# Patient Record
Sex: Female | Born: 1960 | Race: White | Hispanic: No
Health system: Southern US, Community
[De-identification: ages and names within clinical notes are randomized; demographics above are authoritative.]

## PROBLEM LIST (undated history)

## (undated) DIAGNOSIS — I82621 Acute embolism and thrombosis of deep veins of right upper extremity: Secondary | ICD-10-CM

## (undated) DIAGNOSIS — R410 Disorientation, unspecified: Secondary | ICD-10-CM

## (undated) DIAGNOSIS — J9621 Acute and chronic respiratory failure with hypoxia: Secondary | ICD-10-CM

## (undated) DIAGNOSIS — J690 Pneumonitis due to inhalation of food and vomit: Secondary | ICD-10-CM

## (undated) DIAGNOSIS — I63442 Cerebral infarction due to embolism of left cerebellar artery: Secondary | ICD-10-CM

---

## 2020-10-27 ENCOUNTER — Other Ambulatory Visit (HOSPITAL_COMMUNITY): Payer: Medicare HMO

## 2020-10-27 ENCOUNTER — Inpatient Hospital Stay
Admission: AD | Admit: 2020-10-27 | Discharge: 2020-11-16 | Disposition: A | Discharge status: Skilled Nursing Facility | Payer: Medicare HMO | Source: Other Acute Inpatient Hospital | Attending: Internal Medicine | Admitting: Internal Medicine

## 2020-10-27 DIAGNOSIS — J690 Pneumonitis due to inhalation of food and vomit: Secondary | ICD-10-CM | POA: Diagnosis present

## 2020-10-27 DIAGNOSIS — I63442 Cerebral infarction due to embolism of left cerebellar artery: Secondary | ICD-10-CM | POA: Diagnosis present

## 2020-10-27 DIAGNOSIS — T17908A Unspecified foreign body in respiratory tract, part unspecified causing other injury, initial encounter: Secondary | ICD-10-CM

## 2020-10-27 DIAGNOSIS — Z931 Gastrostomy status: Secondary | ICD-10-CM

## 2020-10-27 DIAGNOSIS — J9690 Respiratory failure, unspecified, unspecified whether with hypoxia or hypercapnia: Secondary | ICD-10-CM

## 2020-10-27 DIAGNOSIS — I82621 Acute embolism and thrombosis of deep veins of right upper extremity: Secondary | ICD-10-CM | POA: Diagnosis present

## 2020-10-27 DIAGNOSIS — D72829 Elevated white blood cell count, unspecified: Secondary | ICD-10-CM

## 2020-10-27 DIAGNOSIS — J9621 Acute and chronic respiratory failure with hypoxia: Secondary | ICD-10-CM | POA: Diagnosis present

## 2020-10-27 DIAGNOSIS — R410 Disorientation, unspecified: Secondary | ICD-10-CM | POA: Diagnosis present

## 2020-10-27 DIAGNOSIS — J189 Pneumonia, unspecified organism: Secondary | ICD-10-CM

## 2020-10-27 DIAGNOSIS — Z9289 Personal history of other medical treatment: Secondary | ICD-10-CM

## 2020-10-27 HISTORY — DX: Acute embolism and thrombosis of deep veins of right upper extremity: I82.621

## 2020-10-27 HISTORY — DX: Disorientation, unspecified: R41.0

## 2020-10-27 HISTORY — DX: Cerebral infarction due to embolism of left cerebellar artery: I63.442

## 2020-10-27 HISTORY — DX: Acute and chronic respiratory failure with hypoxia: J96.21

## 2020-10-27 HISTORY — DX: Pneumonitis due to inhalation of food and vomit: J69.0

## 2020-10-27 LAB — HEPARIN LEVEL (UNFRACTIONATED): Heparin Unfractionated: <0.1 IU/mL — ABNORMAL LOW (ref 0.30–0.70)

## 2020-10-27 LAB — PROTIME-INR
INR: 1.3 — ABNORMAL HIGH (ref 0.8–1.2)
Prothrombin Time: 15.4 seconds — ABNORMAL HIGH (ref 11.4–15.2)

## 2020-10-27 LAB — APTT: aPTT: 120 seconds — ABNORMAL HIGH (ref 24–36)

## 2020-10-28 LAB — COMPREHENSIVE METABOLIC PANEL
ALT: 29 U/L (ref 0–44)
AST: 19 U/L (ref 15–41)
Albumin: 2.1 g/dL — ABNORMAL LOW (ref 3.5–5.0)
Alkaline Phosphatase: 63 U/L (ref 38–126)
Anion gap: 10 (ref 5–15)
BUN: 14 mg/dL (ref 6–20)
CO2: 23 mmol/L (ref 22–32)
Calcium: 8.5 mg/dL — ABNORMAL LOW (ref 8.9–10.3)
Chloride: 100 mmol/L (ref 98–111)
Creatinine, Ser: 0.37 mg/dL — ABNORMAL LOW (ref 0.44–1.00)
GFR, Estimated: >60 mL/min (ref >60)
Glucose, Bld: 144 mg/dL — ABNORMAL HIGH (ref 70–99)
Potassium: 3.8 mmol/L (ref 3.5–5.1)
Sodium: 133 mmol/L — ABNORMAL LOW (ref 135–145)
Total Bilirubin: 0.6 mg/dL (ref 0.3–1.2)
Total Protein: 6.2 g/dL — ABNORMAL LOW (ref 6.5–8.1)

## 2020-10-28 LAB — CBC WITH DIFFERENTIAL/PLATELET
Abs Immature Granulocytes: 0.16 10*3/uL — ABNORMAL HIGH (ref 0.00–0.07)
Basophils Absolute: 0 10*3/uL (ref 0.0–0.1)
Basophils Relative: 0 %
Eosinophils Absolute: 0.2 10*3/uL (ref 0.0–0.5)
Eosinophils Relative: 1 %
HCT: 25.5 % — ABNORMAL LOW (ref 36.0–46.0)
Hemoglobin: 8.2 g/dL — ABNORMAL LOW (ref 12.0–15.0)
Immature Granulocytes: 1 %
Lymphocytes Relative: 9 %
Lymphs Abs: 1.2 10*3/uL (ref 0.7–4.0)
MCH: 29.3 pg (ref 26.0–34.0)
MCHC: 32.2 g/dL (ref 30.0–36.0)
MCV: 91.1 fL (ref 80.0–100.0)
Monocytes Absolute: 1.1 10*3/uL — ABNORMAL HIGH (ref 0.1–1.0)
Monocytes Relative: 8 %
Neutro Abs: 11.1 10*3/uL — ABNORMAL HIGH (ref 1.7–7.7)
Neutrophils Relative %: 81 %
Platelets: 258 10*3/uL (ref 150–400)
RBC: 2.8 MIL/uL — ABNORMAL LOW (ref 3.87–5.11)
RDW: 14.8 % (ref 11.5–15.5)
WBC: 13.7 10*3/uL — ABNORMAL HIGH (ref 4.0–10.5)
nRBC: 0 % (ref 0.0–0.2)

## 2020-10-28 LAB — HEPARIN LEVEL (UNFRACTIONATED)
Heparin Unfractionated: 0.3 IU/mL (ref 0.30–0.70)
Heparin Unfractionated: 0.31 IU/mL (ref 0.30–0.70)

## 2020-10-29 ENCOUNTER — Encounter: Payer: Self-pay | Admitting: Internal Medicine

## 2020-10-29 DIAGNOSIS — R410 Disorientation, unspecified: Secondary | ICD-10-CM | POA: Diagnosis not present

## 2020-10-29 DIAGNOSIS — I82621 Acute embolism and thrombosis of deep veins of right upper extremity: Secondary | ICD-10-CM | POA: Diagnosis present

## 2020-10-29 DIAGNOSIS — J9621 Acute and chronic respiratory failure with hypoxia: Secondary | ICD-10-CM | POA: Diagnosis not present

## 2020-10-29 DIAGNOSIS — I63442 Cerebral infarction due to embolism of left cerebellar artery: Secondary | ICD-10-CM | POA: Diagnosis not present

## 2020-10-29 DIAGNOSIS — J690 Pneumonitis due to inhalation of food and vomit: Secondary | ICD-10-CM | POA: Diagnosis not present

## 2020-10-29 LAB — HEPARIN LEVEL (UNFRACTIONATED)
Heparin Unfractionated: 0.38 IU/mL (ref 0.30–0.70)
Heparin Unfractionated: 0.34 IU/mL (ref 0.30–0.70)

## 2020-10-29 NOTE — Consult Note (Addendum)
Pulmonary Critical Care Medicine [facility] [place]  PULMONARY SERVICE  Date of Service: 10/29/2020  PULMONARY CRITICAL CARE CONSULT   LIRA STEPHEN  CLE:[id number]  DOB: 1961-09-25   DOA: 10/27/2020  Referring Physician: Carron Curie, MD  HPI: EMI LYMON is a 59 y.o. female seen for follow up of Acute on Chronic Respiratory Failure. Patient has multiple medical problems who was transferred for further management.  Apparently was admitted to the hospital with left-sided ataxia and sensory loss at that time was evaluated and found to have an acute stroke.  Patient was admitted to the emergency room with a new left-sided hemiparesis which had not been present on previous admission.  Patient was deemed to be out of the window for TPA CT angiogram was performed which showed a new area of thrombus in the left vertebral artery.  The patient had also been apparently complaining about neck pain.  Admitted to [facility] and was started on heparin drip.  CT scans were done to evaluate the evolution.  Eventually an MRI was found there was a infarction on the right side of the cord down to the level of C3-C4 she was placed on steroids for this finding.  Patient had a DVT also diagnosed and was continued on heparin.  Transferred to our facility for further management and weaning.  Review of Systems:  ROS performed and is unremarkable other than noted above.  Past medical history: CVA Thrombosis Neck mass Delirium Alcohol abuse Hyperlipidemia Dysphagia DVT Hypertension GERD Depression  Past surgical history: Breast biopsy Lumpectomy Tubal ligation  Allergies: Norco  Family history: Hypertension Hypothyroidism  Medications: Reviewed on Rounds  Physical Exam:  Vitals: Temperature is 97.2 pulse 92 respiratory rate 14 blood pressure is 102/68 saturations 94%  Ventilator Settings currently is off the ventilator on T collar FiO2  28%  . General: Comfortable at this time . Eyes: Grossly normal lids, irises & conjunctiva . ENT: grossly tongue is normal . Neck: no obvious mass . Cardiovascular: S1-S2 normal no gallop or rub . Respiratory: No rhonchi coarse breath sounds . Abdomen: Soft and nontender . Skin: no rash seen on limited exam . Musculoskeletal: not rigid . Psychiatric:unable to assess . Neurologic: no seizure no involuntary movements         Labs on Admission:  Basic Metabolic Panel: Recent Labs  Lab 10/28/20 0541  NA 133*  K 3.8  CL 100  CO2 23  GLUCOSE 144*  BUN 14  CREATININE 0.37*  CALCIUM 8.5*    No results for input(s): PHART, PCO2ART, PO2ART, HCO3, O2SAT in the last 168 hours.  Liver Function Tests: Recent Labs  Lab 10/28/20 0541  AST 19  ALT 29  ALKPHOS 63  BILITOT 0.6  PROT 6.2*  ALBUMIN 2.1*   No results for input(s): LIPASE, AMYLASE in the last 168 hours. No results for input(s): AMMONIA in the last 168 hours.  CBC: Recent Labs  Lab 10/28/20 0541  WBC 13.7*  NEUTROABS 11.1*  HGB 8.2*  HCT 25.5*  MCV 91.1  PLT 258    Cardiac Enzymes: No results for input(s): CKTOTAL, CKMB, CKMBINDEX, TROPONINI in the last 168 hours.  BNP (last 3 results) No results for input(s): BNP in the last 8760 hours.  ProBNP (last 3 results) No results for input(s): PROBNP in the last 8760 hours.   Radiological Exams on Admission: DG ABDOMEN PEG TUBE LOCATION  Result Date: 10/27/2020 CLINICAL DATA:  Headache tube placement. EXAM: ABDOMEN - 1  VIEW COMPARISON:  None. FINDINGS: Contrast was injected through the percutaneous gastrostomy tube, filling the stomach with early flow into the duodenum. No sign of extravasation. Gas pattern otherwise unremarkable. IMPRESSION: Gastrostomy tube well positioned in the stomach. Electronically Signed   By: Paulina Fusi M.D.   On: 10/27/2020 16:49   DG CHEST PORT 1 VIEW  Result Date: 10/27/2020 CLINICAL DATA:  Pneumonia EXAM: PORTABLE CHEST 1  VIEW COMPARISON:  None. FINDINGS: Tracheostomy tube in place. Hazy pulmonary infiltrates in the right upper lobe and left lower lobe. No dense consolidation or lobar collapse. No effusions. No acute bone finding. IMPRESSION: Hazy pulmonary infiltrates in the right upper lobe and left lower lobe consistent with pneumonia. Electronically Signed   By: Paulina Fusi M.D.   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 Acute on chronic respiratory failure with hypoxia patient at this time is off the ventilator on T collar.  Requiring 28% FiO2 oxygen titrated as deemed necessary. 2. DVT upper extremity right side has been on anticoagulation which should be continued. 3. Pneumonia due to aspiration has been treated with antibiotics patient was apparently diagnosed with stenotrophomonas. 4. Left medullary cerebellar infarct supportive care physical therapy as necessary. 5. Delirium acute now is stable supportive care medical management  I have personally seen and evaluated the patient, evaluated laboratory and imaging results, formulated the assessment and plan and placed orders. The Patient requires high complexity decision making with multiple systems involvement.  Case was discussed on Rounds with the Respiratory Therapy Director and the Respiratory staff Time Spent  Yevonne Pax, MD [facility] Pulmonary Critical Care Medicine Sleep Medicine

## 2020-10-30 DIAGNOSIS — R410 Disorientation, unspecified: Secondary | ICD-10-CM | POA: Diagnosis not present

## 2020-10-30 DIAGNOSIS — J9621 Acute and chronic respiratory failure with hypoxia: Secondary | ICD-10-CM | POA: Diagnosis not present

## 2020-10-30 DIAGNOSIS — J690 Pneumonitis due to inhalation of food and vomit: Secondary | ICD-10-CM | POA: Diagnosis not present

## 2020-10-30 DIAGNOSIS — I63442 Cerebral infarction due to embolism of left cerebellar artery: Secondary | ICD-10-CM | POA: Diagnosis not present

## 2020-10-30 LAB — BASIC METABOLIC PANEL
Anion gap: 6 (ref 5–15)
BUN: 14 mg/dL (ref 6–20)
CO2: 25 mmol/L (ref 22–32)
Calcium: 8.7 mg/dL — ABNORMAL LOW (ref 8.9–10.3)
Chloride: 103 mmol/L (ref 98–111)
Creatinine, Ser: 0.39 mg/dL — ABNORMAL LOW (ref 0.44–1.00)
GFR, Estimated: >60 mL/min (ref >60)
Glucose, Bld: 135 mg/dL — ABNORMAL HIGH (ref 70–99)
Potassium: 3.9 mmol/L (ref 3.5–5.1)
Sodium: 134 mmol/L — ABNORMAL LOW (ref 135–145)

## 2020-10-30 LAB — CBC
HCT: 24.9 % — ABNORMAL LOW (ref 36.0–46.0)
Hemoglobin: 8 g/dL — ABNORMAL LOW (ref 12.0–15.0)
MCH: 29.2 pg (ref 26.0–34.0)
MCHC: 32.1 g/dL (ref 30.0–36.0)
MCV: 90.9 fL (ref 80.0–100.0)
Platelets: 262 10*3/uL (ref 150–400)
RBC: 2.74 MIL/uL — ABNORMAL LOW (ref 3.87–5.11)
RDW: 15 % (ref 11.5–15.5)
WBC: 9.6 10*3/uL (ref 4.0–10.5)
nRBC: 0 % (ref 0.0–0.2)

## 2020-10-30 LAB — HEPARIN LEVEL (UNFRACTIONATED)
Heparin Unfractionated: 0.44 IU/mL (ref 0.30–0.70)
Heparin Unfractionated: 0.34 IU/mL (ref 0.30–0.70)

## 2020-10-30 NOTE — Progress Notes (Signed)
Pulmonary Critical Care Medicine [facility] [place]   PULMONARY CRITICAL CARE SERVICE  PROGRESS NOTE  Date of Service: 10/30/2020  Jasmine Davis  YOV:[id number]  DOB: 05-28-1961   DOA: 10/27/2020  Referring Physician: Carron Curie, MD  HPI: Jasmine Davis is a 59 y.o. female seen for follow up of Acute on Chronic Respiratory Failure.  Patient is on T collar currently on 20% FiO2 secretions are copious requiring frequent suctioning still  Medications: Reviewed on Rounds  Physical Exam:  Vitals: Temperature 98.7 pulse 90 respiratory rate is 20 blood pressure is 130/60  Ventilator Settings off the ventilator on T collar FiO2 28%  . General: Comfortable at this time . Eyes: Grossly normal lids, irises & conjunctiva . ENT: grossly tongue is normal . Neck: no obvious mass . Cardiovascular: S1 S2 normal no gallop . Respiratory: No rhonchi very coarse breath sounds . Abdomen: soft . Skin: no rash seen on limited exam . Musculoskeletal: not rigid . Psychiatric:unable to assess . Neurologic: no seizure no involuntary movements         Lab Data:   Basic Metabolic Panel: Recent Labs  Lab 10/28/20 0541 10/30/20 0432  NA 133* 134*  K 3.8 3.9  CL 100 103  CO2 23 25  GLUCOSE 144* 135*  BUN 14 14  CREATININE 0.37* 0.39*  CALCIUM 8.5* 8.7*    ABG: No results for input(s): PHART, PCO2ART, PO2ART, HCO3, O2SAT in the last 168 hours.  Liver Function Tests: Recent Labs  Lab 10/28/20 0541  AST 19  ALT 29  ALKPHOS 63  BILITOT 0.6  PROT 6.2*  ALBUMIN 2.1*   No results for input(s): LIPASE, AMYLASE in the last 168 hours. No results for input(s): AMMONIA in the last 168 hours.  CBC: Recent Labs  Lab 10/28/20 0541 10/30/20 0432  WBC 13.7* 9.6  NEUTROABS 11.1*  --   HGB 8.2* 8.0*  HCT 25.5* 24.9*  MCV 91.1 90.9  PLT 258 262    Cardiac Enzymes: No results for input(s): CKTOTAL, CKMB, CKMBINDEX, TROPONINI in the last 168 hours.  BNP (last 3  results) No results for input(s): BNP in the last 8760 hours.  ProBNP (last 3 results) No results for input(s): PROBNP in the last 8760 hours.  Radiological Exams: No results found.  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 Acute on chronic respiratory failure hypoxia we will continue with T collar trials.  Titrate oxygen continue pulmonary toilet. 2. DVT treated we will continue to follow along 3. Aspiration pneumonia due to gastric secretions continue supportive care 4. Cerebral infarction no change 5. Acute delirium we will continue to follow and continue with medical management   I have personally seen and evaluated the patient, evaluated laboratory and imaging results, formulated the assessment and plan and placed orders. The Patient requires high complexity decision making with multiple systems involvement.  Rounds were done with the Respiratory Therapy Director and Staff therapists and discussed with nursing staff also.  Yevonne Pax, MD [facility] Pulmonary Critical Care Medicine Sleep Medicine

## 2020-10-31 DIAGNOSIS — R410 Disorientation, unspecified: Secondary | ICD-10-CM | POA: Diagnosis not present

## 2020-10-31 DIAGNOSIS — J9621 Acute and chronic respiratory failure with hypoxia: Secondary | ICD-10-CM | POA: Diagnosis not present

## 2020-10-31 DIAGNOSIS — I63442 Cerebral infarction due to embolism of left cerebellar artery: Secondary | ICD-10-CM | POA: Diagnosis not present

## 2020-10-31 DIAGNOSIS — J690 Pneumonitis due to inhalation of food and vomit: Secondary | ICD-10-CM | POA: Diagnosis not present

## 2020-10-31 LAB — HEPARIN LEVEL (UNFRACTIONATED)
Heparin Unfractionated: 0.36 IU/mL (ref 0.30–0.70)
Heparin Unfractionated: 0.24 IU/mL — ABNORMAL LOW (ref 0.30–0.70)
Heparin Unfractionated: 0.46 IU/mL (ref 0.30–0.70)

## 2020-10-31 NOTE — Progress Notes (Signed)
Pulmonary Critical Care Medicine [facility] [place]   PULMONARY CRITICAL CARE SERVICE  PROGRESS NOTE  Date of Service: 10/31/2020  Jasmine Davis  TGY:[id number]  DOB: May 22, 1961   DOA: 10/27/2020  Referring Physician: Carron Curie, MD  HPI: Jasmine Davis is a 59 y.o. female seen for follow up of Acute on Chronic Respiratory Failure.  Patient right now is on T collar has been on 20% FiO2 with good saturations.  Medications: Reviewed on Rounds  Physical Exam:  Vitals: Temperature is 96.9 pulse 88 respiratory rate 20 blood pressure is 104/71 saturations 96%  Ventilator Settings on T collar with an FiO2 28%  . General: Comfortable at this time . Eyes: Grossly normal lids, irises & conjunctiva . ENT: grossly tongue is normal . Neck: no obvious mass . Cardiovascular: S1 S2 normal no gallop . Respiratory: No rhonchi no rales noted at this time . Abdomen: soft . Skin: no rash seen on limited exam . Musculoskeletal: not rigid . Psychiatric:unable to assess . Neurologic: no seizure no involuntary movements         Lab Data:   Basic Metabolic Panel: Recent Labs  Lab 10/28/20 0541 10/30/20 0432  NA 133* 134*  K 3.8 3.9  CL 100 103  CO2 23 25  GLUCOSE 144* 135*  BUN 14 14  CREATININE 0.37* 0.39*  CALCIUM 8.5* 8.7*    ABG: No results for input(s): PHART, PCO2ART, PO2ART, HCO3, O2SAT in the last 168 hours.  Liver Function Tests: Recent Labs  Lab 10/28/20 0541  AST 19  ALT 29  ALKPHOS 63  BILITOT 0.6  PROT 6.2*  ALBUMIN 2.1*   No results for input(s): LIPASE, AMYLASE in the last 168 hours. No results for input(s): AMMONIA in the last 168 hours.  CBC: Recent Labs  Lab 10/28/20 0541 10/30/20 0432  WBC 13.7* 9.6  NEUTROABS 11.1*  --   HGB 8.2* 8.0*  HCT 25.5* 24.9*  MCV 91.1 90.9  PLT 258 262    Cardiac Enzymes: No results for input(s): CKTOTAL, CKMB, CKMBINDEX, TROPONINI in the last 168 hours.  BNP (last 3 results) No results for  input(s): BNP in the last 8760 hours.  ProBNP (last 3 results) No results for input(s): PROBNP in the last 8760 hours.  Radiological Exams: No results found.  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 Acute on chronic respiratory failure hypoxia patient currently is on T collar has been on 28% FiO2 with good saturations. 2. DVT right upper extremity treated we will continue to follow along. 3. Aspiration pneumonia supportive care slow improvement 4. Cerebral infarction therapy as tolerated 5. Acute delirium patient's of baseline right now   I have personally seen and evaluated the patient, evaluated laboratory and imaging results, formulated the assessment and plan and placed orders. The Patient requires high complexity decision making with multiple systems involvement.  Rounds were done with the Respiratory Therapy Director and Staff therapists and discussed with nursing staff also.  Yevonne Pax, MD [facility] Pulmonary Critical Care Medicine Sleep Medicine

## 2020-11-01 ENCOUNTER — Other Ambulatory Visit (HOSPITAL_COMMUNITY): Payer: Medicare HMO

## 2020-11-01 DIAGNOSIS — R410 Disorientation, unspecified: Secondary | ICD-10-CM | POA: Diagnosis not present

## 2020-11-01 DIAGNOSIS — J9621 Acute and chronic respiratory failure with hypoxia: Secondary | ICD-10-CM | POA: Diagnosis not present

## 2020-11-01 DIAGNOSIS — I63442 Cerebral infarction due to embolism of left cerebellar artery: Secondary | ICD-10-CM | POA: Diagnosis not present

## 2020-11-01 DIAGNOSIS — J690 Pneumonitis due to inhalation of food and vomit: Secondary | ICD-10-CM | POA: Diagnosis not present

## 2020-11-01 LAB — HEPARIN LEVEL (UNFRACTIONATED)
Heparin Unfractionated: 0.34 IU/mL (ref 0.30–0.70)
Heparin Unfractionated: 0.3 IU/mL (ref 0.30–0.70)

## 2020-11-01 NOTE — Progress Notes (Signed)
Pulmonary Critical Care Medicine [facility] [place]   PULMONARY CRITICAL CARE SERVICE  PROGRESS NOTE  Date of Service: 11/01/2020  SWAN FAIRFAX  NTI:[id number]  DOB: [birth year]   DOA: 10/27/2020  Referring Physician: Carron Curie, MD  HPI: Jasmine Davis is a 59 y.o. female seen for follow up of Acute on Chronic Respiratory Failure.  Continues to be on T collar copious secretions are noted remains on 28% FiO2  Medications: Reviewed on Rounds  Physical Exam:  Vitals: Temperature is 99.6 pulse 104 respiratory rate 20 blood pressure is 98/67 saturations 97%  Ventilator Settings on T collar with an FiO2 28%  . General: Comfortable at this time . Eyes: Grossly normal lids, irises & conjunctiva . ENT: grossly tongue is normal . Neck: no obvious mass . Cardiovascular: S1 S2 normal no gallop . Respiratory: No rhonchi very coarse breath sounds . Abdomen: soft . Skin: no rash seen on limited exam . Musculoskeletal: not rigid . Psychiatric:unable to assess . Neurologic: no seizure no involuntary movements         Lab Data:   Basic Metabolic Panel: Recent Labs  Lab 10/28/20 0541 10/30/20 0432  NA 133* 134*  K 3.8 3.9  CL 100 103  CO2 23 25  GLUCOSE 144* 135*  BUN 14 14  CREATININE 0.37* 0.39*  CALCIUM 8.5* 8.7*    ABG: No results for input(s): PHART, PCO2ART, PO2ART, HCO3, O2SAT in the last 168 hours.  Liver Function Tests: Recent Labs  Lab 10/28/20 0541  AST 19  ALT 29  ALKPHOS 63  BILITOT 0.6  PROT 6.2*  ALBUMIN 2.1*   No results for input(s): LIPASE, AMYLASE in the last 168 hours. No results for input(s): AMMONIA in the last 168 hours.  CBC: Recent Labs  Lab 10/28/20 0541 10/30/20 0432  WBC 13.7* 9.6  NEUTROABS 11.1*  --   HGB 8.2* 8.0*  HCT 25.5* 24.9*  MCV 91.1 90.9  PLT 258 262    Cardiac Enzymes: No results for input(s): CKTOTAL, CKMB, CKMBINDEX, TROPONINI in the last 168 hours.  BNP (last 3 results) No results for  input(s): BNP in the last 8760 hours.  ProBNP (last 3 results) No results for input(s): PROBNP in the last 8760 hours.  Radiological Exams: DG CHEST PORT 1 VIEW  Result Date: 11/01/2020 CLINICAL DATA:  Respiratory failure. EXAM: PORTABLE CHEST 1 VIEW COMPARISON:  Radiograph 10/27/2020 FINDINGS: Tip of the enteric tube at the thoracic inlet. Improving right upper lobe opacity from prior exam. Improving left lower lobe opacity from prior exam. Mild residual patchy airspace disease persists. No new consolidation. Normal heart size with unchanged mediastinal contours. No pneumothorax or significant pleural effusion. Stable osseous structures. IMPRESSION: Improving bilateral airspace disease with mild residual patchy opacities. Electronically Signed   By: Narda Rutherford M.D.   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 Acute on chronic respiratory failure with hypoxia Jasmine Davis is on T collar on 28% FiO2 continue with aggressive pulmonary toilet. 2. DVT treated we will continue supportive care 3. Aspiration pneumonia we will continue present management 4. Cerebral infarction supportive care we will continue to monitor 5. Acute delirium resolved   I have personally seen and evaluated  the patient, evaluated laboratory and imaging results, formulated the assessment and plan and placed orders. The Patient requires high complexity decision making with multiple systems involvement.  Rounds were done with the Respiratory Therapy Director and Staff therapists and discussed with nursing staff also.  Yevonne Pax, MD [facility] Pulmonary Critical Care Medicine Sleep Medicine

## 2020-11-02 LAB — BASIC METABOLIC PANEL
Anion gap: 11 (ref 5–15)
BUN: 13 mg/dL (ref 6–20)
CO2: 22 mmol/L (ref 22–32)
Calcium: 9 mg/dL (ref 8.9–10.3)
Chloride: 99 mmol/L (ref 98–111)
Creatinine, Ser: 0.48 mg/dL (ref 0.44–1.00)
GFR, Estimated: >60 mL/min (ref >60)
Glucose, Bld: 129 mg/dL — ABNORMAL HIGH (ref 70–99)
Potassium: 4 mmol/L (ref 3.5–5.1)
Sodium: 132 mmol/L — ABNORMAL LOW (ref 135–145)

## 2020-11-02 LAB — CBC
HCT: 29.4 % — ABNORMAL LOW (ref 36.0–46.0)
Hemoglobin: 9.4 g/dL — ABNORMAL LOW (ref 12.0–15.0)
MCH: 29.3 pg (ref 26.0–34.0)
MCHC: 32 g/dL (ref 30.0–36.0)
MCV: 91.6 fL (ref 80.0–100.0)
Platelets: 300 10*3/uL (ref 150–400)
RBC: 3.21 MIL/uL — ABNORMAL LOW (ref 3.87–5.11)
RDW: 15.1 % (ref 11.5–15.5)
WBC: 7.2 10*3/uL (ref 4.0–10.5)
nRBC: 0 % (ref 0.0–0.2)

## 2020-11-02 LAB — CULTURE, RESPIRATORY W GRAM STAIN

## 2020-11-02 LAB — HEPARIN LEVEL (UNFRACTIONATED)
Heparin Unfractionated: 0.43 IU/mL (ref 0.30–0.70)
Heparin Unfractionated: 0.32 IU/mL (ref 0.30–0.70)
Heparin Unfractionated: 0.37 IU/mL (ref 0.30–0.70)

## 2020-11-03 DIAGNOSIS — J690 Pneumonitis due to inhalation of food and vomit: Secondary | ICD-10-CM | POA: Diagnosis not present

## 2020-11-03 DIAGNOSIS — I63442 Cerebral infarction due to embolism of left cerebellar artery: Secondary | ICD-10-CM | POA: Diagnosis not present

## 2020-11-03 DIAGNOSIS — R410 Disorientation, unspecified: Secondary | ICD-10-CM | POA: Diagnosis not present

## 2020-11-03 DIAGNOSIS — J9621 Acute and chronic respiratory failure with hypoxia: Secondary | ICD-10-CM | POA: Diagnosis not present

## 2020-11-03 LAB — CULTURE, RESPIRATORY W GRAM STAIN: Culture: NORMAL

## 2020-11-03 LAB — HEPARIN LEVEL (UNFRACTIONATED)
Heparin Unfractionated: 0.33 IU/mL (ref 0.30–0.70)
Heparin Unfractionated: 0.18 IU/mL — ABNORMAL LOW (ref 0.30–0.70)

## 2020-11-03 NOTE — Progress Notes (Signed)
Pulmonary Critical Care Medicine [facility] [place]   PULMONARY CRITICAL CARE SERVICE  PROGRESS NOTE  Date of Service: 11/03/2020  Jasmine Davis  PYP:[id number]  DOB: 09/08/1961   DOA: 10/27/2020  Referring Physician: Carron Curie, MD  HPI: Jasmine Davis is a 59 y.o. female seen for follow up of Acute on Chronic Respiratory Failure. Patient is on T collar currently on 28% FiO2 with good saturations.  Medications: Reviewed on Rounds  Physical Exam:  Vitals: Temperature is 96.6 pulse 71 respiratory rate 20 blood pressure is 111/74 saturations are 100%  Ventilator Settings on T collar currently is on 28% FiO2  . General: Comfortable at this time . Eyes: Grossly normal lids, irises & conjunctiva . ENT: grossly tongue is normal . Neck: no obvious mass . Cardiovascular: S1 S2 normal no gallop . Respiratory: No rhonchi no rales are noted at this time . Abdomen: soft . Skin: no rash seen on limited exam . Musculoskeletal: not rigid . Psychiatric:unable to assess . Neurologic: no seizure no involuntary movements         Lab Data:   Basic Metabolic Panel: Recent Labs  Lab 10/28/20 0541 10/30/20 0432 11/02/20 0558  NA 133* 134* 132*  K 3.8 3.9 4.0  CL 100 103 99  CO2 23 25 22   GLUCOSE 144* 135* 129*  BUN 14 14 13   CREATININE 0.37* 0.39* 0.48  CALCIUM 8.5* 8.7* 9.0    ABG: No results for input(s): PHART, PCO2ART, PO2ART, HCO3, O2SAT in the last 168 hours.  Liver Function Tests: Recent Labs  Lab 10/28/20 0541  AST 19  ALT 29  ALKPHOS 63  BILITOT 0.6  PROT 6.2*  ALBUMIN 2.1*   No results for input(s): LIPASE, AMYLASE in the last 168 hours. No results for input(s): AMMONIA in the last 168 hours.  CBC: Recent Labs  Lab 10/28/20 0541 10/30/20 0432 11/02/20 0558  WBC 13.7* 9.6 7.2  NEUTROABS 11.1*  --   --   HGB 8.2* 8.0* 9.4*  HCT 25.5* 24.9* 29.4*  MCV 91.1 90.9 91.6  PLT 258 262 300    Cardiac Enzymes: No results for input(s):  CKTOTAL, CKMB, CKMBINDEX, TROPONINI in the last 168 hours.  BNP (last 3 results) No results for input(s): BNP in the last 8760 hours.  ProBNP (last 3 results) No results for input(s): PROBNP in the last 8760 hours.  Radiological Exams: DG CHEST PORT 1 VIEW  Result Date: 11/01/2020 CLINICAL DATA:  Respiratory failure. EXAM: PORTABLE CHEST 1 VIEW COMPARISON:  Radiograph 10/27/2020 FINDINGS: Tip of the enteric tube at the thoracic inlet. Improving right upper lobe opacity from prior exam. Improving left lower lobe opacity from prior exam. Mild residual patchy airspace disease persists. No new consolidation. Normal heart size with unchanged mediastinal contours. No pneumothorax or significant pleural effusion. Stable osseous structures. IMPRESSION: Improving bilateral airspace disease with mild residual patchy opacities. Electronically Signed   By: 13/02/2020 M.D.   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 Acute on chronic respiratory failure hypoxia we will continue with T collar currently is on 28% FiO2 good saturations are noted. 2. DVT treated we will continue supportive care 3. Aspiration pneumonia at baseline 4. Cerebral  infarction no change we will continue to follow 5. Acute delirium patient is at baseline.   I have personally seen and evaluated the patient, evaluated laboratory and imaging results, formulated the assessment and plan and placed orders. The Patient requires high complexity decision making with multiple systems involvement.  Rounds were done with the Respiratory Therapy Director and Staff therapists and discussed with nursing staff also.  Yevonne Pax, MD [facility] Pulmonary Critical  Care Medicine Sleep Medicine

## 2020-11-04 DIAGNOSIS — J9621 Acute and chronic respiratory failure with hypoxia: Secondary | ICD-10-CM | POA: Diagnosis not present

## 2020-11-04 DIAGNOSIS — I63442 Cerebral infarction due to embolism of left cerebellar artery: Secondary | ICD-10-CM | POA: Diagnosis not present

## 2020-11-04 DIAGNOSIS — R410 Disorientation, unspecified: Secondary | ICD-10-CM | POA: Diagnosis not present

## 2020-11-04 DIAGNOSIS — J690 Pneumonitis due to inhalation of food and vomit: Secondary | ICD-10-CM | POA: Diagnosis not present

## 2020-11-04 LAB — HEPARIN LEVEL (UNFRACTIONATED): Heparin Unfractionated: >2.2 IU/mL — ABNORMAL HIGH (ref 0.30–0.70)

## 2020-11-04 NOTE — Progress Notes (Signed)
Pulmonary Critical Care Medicine [facility] [place]   PULMONARY CRITICAL CARE SERVICE  PROGRESS NOTE  Date of Service: 11/04/2020  Jasmine Davis  QVZ:[id number]  DOB: Mar 02, 1961   DOA: 10/27/2020  Referring Physician: Carron Curie, MD  HPI: Jasmine Davis is a 59 y.o. female seen for follow up of Acute on Chronic Respiratory Failure. Patient currently is on T collar has been on 28% FiO2 with good saturations.  Medications: Reviewed on Rounds  Physical Exam:  Vitals: Temperature is 98.9 pulse 92 respiratory 20 blood pressure is 103/61 saturations 97%  Ventilator Settings on T collar right now on 28% FiO2  . General: Comfortable at this time . Eyes: Grossly normal lids, irises & conjunctiva . ENT: grossly tongue is normal . Neck: no obvious mass . Cardiovascular: S1 S2 normal no gallop . Respiratory: No rhonchi no rales are noted at this time . Abdomen: soft . Skin: no rash seen on limited exam . Musculoskeletal: not rigid . Psychiatric:unable to assess . Neurologic: no seizure no involuntary movements         Lab Data:   Basic Metabolic Panel: Recent Labs  Lab 10/30/20 0432 11/02/20 0558  NA 134* 132*  K 3.9 4.0  CL 103 99  CO2 25 22  GLUCOSE 135* 129*  BUN 14 13  CREATININE 0.39* 0.48  CALCIUM 8.7* 9.0    ABG: No results for input(s): PHART, PCO2ART, PO2ART, HCO3, O2SAT in the last 168 hours.  Liver Function Tests: No results for input(s): AST, ALT, ALKPHOS, BILITOT, PROT, ALBUMIN in the last 168 hours. No results for input(s): LIPASE, AMYLASE in the last 168 hours. No results for input(s): AMMONIA in the last 168 hours.  CBC: Recent Labs  Lab 10/30/20 0432 11/02/20 0558  WBC 9.6 7.2  HGB 8.0* 9.4*  HCT 24.9* 29.4*  MCV 90.9 91.6  PLT 262 300    Cardiac Enzymes: No results for input(s): CKTOTAL, CKMB, CKMBINDEX, TROPONINI in the last 168 hours.  BNP (last 3 results) No results for input(s): BNP in the last 8760 hours.  ProBNP  (last 3 results) No results for input(s): PROBNP in the last 8760 hours.  Radiological Exams: No results found.  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 Acute on chronic respiratory failure with hypoxia we will continue with T collar right now on 28% FiO2 Saturations are noted. 2. DVT supportive care we will continue to monitor. 3. Aspiration pneumonia treated we will continue with present management 4. Cerebral infarction at baseline we will continue with supportive care 5. Acute delirium no change   I have personally seen and evaluated the patient, evaluated laboratory and imaging results, formulated the assessment and plan and placed orders. The Patient requires high complexity decision making with multiple systems involvement.  Rounds were done with the Respiratory Therapy Director and Staff therapists and discussed with nursing staff also.  Jasmine Pax, MD [facility] Pulmonary Critical Care Medicine Sleep Medicine

## 2020-11-06 DIAGNOSIS — J690 Pneumonitis due to inhalation of food and vomit: Secondary | ICD-10-CM | POA: Diagnosis not present

## 2020-11-06 DIAGNOSIS — R410 Disorientation, unspecified: Secondary | ICD-10-CM | POA: Diagnosis not present

## 2020-11-06 DIAGNOSIS — I63442 Cerebral infarction due to embolism of left cerebellar artery: Secondary | ICD-10-CM | POA: Diagnosis not present

## 2020-11-06 DIAGNOSIS — J9621 Acute and chronic respiratory failure with hypoxia: Secondary | ICD-10-CM | POA: Diagnosis not present

## 2020-11-06 NOTE — Progress Notes (Signed)
Pulmonary Critical Care Medicine [facility] [place]   PULMONARY CRITICAL CARE SERVICE  PROGRESS NOTE  Date of Service: 11/06/2020  Jasmine Davis  QIO:[id number]  DOB: Sep 22, 1961   DOA: 10/27/2020  Referring Physician: Carron Curie, MD  HPI: Jasmine Davis is a 59 y.o. female seen for follow up of Acute on Chronic Respiratory Failure.  Patient currently is on T collar has been on 28% FiO2 with good saturations.  Medications: Reviewed on Rounds  Physical Exam:  Vitals: Temperature 99.4 pulse 99 respiratory rate is 1 1 blood pressure is 112/81 saturations 98%  Ventilator Settings on T collar with an FiO2 of 28%  . General: Comfortable at this time . Eyes: Grossly normal lids, irises & conjunctiva . ENT: grossly tongue is normal . Neck: no obvious mass . Cardiovascular: S1 S2 normal no gallop . Respiratory: Scattered rhonchi expansion is equal . Abdomen: soft . Skin: no rash seen on limited exam . Musculoskeletal: not rigid . Psychiatric:unable to assess . Neurologic: no seizure no involuntary movements         Lab Data:   Basic Metabolic Panel: Recent Labs  Lab 11/02/20 0558  NA 132*  K 4.0  CL 99  CO2 22  GLUCOSE 129*  BUN 13  CREATININE 0.48  CALCIUM 9.0    ABG: No results for input(s): PHART, PCO2ART, PO2ART, HCO3, O2SAT in the last 168 hours.  Liver Function Tests: No results for input(s): AST, ALT, ALKPHOS, BILITOT, PROT, ALBUMIN in the last 168 hours. No results for input(s): LIPASE, AMYLASE in the last 168 hours. No results for input(s): AMMONIA in the last 168 hours.  CBC: Recent Labs  Lab 11/02/20 0558  WBC 7.2  HGB 9.4*  HCT 29.4*  MCV 91.6  PLT 300    Cardiac Enzymes: No results for input(s): CKTOTAL, CKMB, CKMBINDEX, TROPONINI in the last 168 hours.  BNP (last 3 results) No results for input(s): BNP in the last 8760 hours.  ProBNP (last 3 results) No results for input(s): PROBNP in the last 8760 hours.  Radiological  Exams: No results found.  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 Acute on chronic respiratory failure hypoxia we will continue with T-piece titrate oxygen continue pulmonary toilet. 2. DVT treated we will continue with supportive care 3. Aspiration pneumonia slow improvement 4. Cerebral infarction no change therapy as tolerated 5. Delirium at baseline    I have personally seen and evaluated the patient, evaluated laboratory and imaging results, formulated the assessment and plan and placed orders. The Patient requires high complexity decision making with multiple systems involvement.  Rounds were done with the Respiratory Therapy Director and Staff therapists and discussed with nursing staff also.  Yevonne Pax, MD [facility] Pulmonary Critical Care Medicine Sleep Medicine

## 2020-11-07 DIAGNOSIS — J690 Pneumonitis due to inhalation of food and vomit: Secondary | ICD-10-CM | POA: Diagnosis not present

## 2020-11-07 DIAGNOSIS — J9621 Acute and chronic respiratory failure with hypoxia: Secondary | ICD-10-CM | POA: Diagnosis not present

## 2020-11-07 DIAGNOSIS — R410 Disorientation, unspecified: Secondary | ICD-10-CM | POA: Diagnosis not present

## 2020-11-07 DIAGNOSIS — I63442 Cerebral infarction due to embolism of left cerebellar artery: Secondary | ICD-10-CM | POA: Diagnosis not present

## 2020-11-07 NOTE — Progress Notes (Signed)
Pulmonary Critical Care Medicine [facility] [place]   PULMONARY CRITICAL CARE SERVICE  PROGRESS NOTE  Date of Service: 11/07/2020  JOLAYNE BRANSON  KPT:[id number]  DOB: Jun 28, 1961   DOA: 10/27/2020  Referring Physician: Carron Curie, MD  HPI: KAREENA ARRAMBIDE is a 59 y.o. female seen for follow up of Acute on Chronic Respiratory Failure.  Patient is on T collar currently on 28% FiO2 secretions are fair to moderate  Medications: Reviewed on Rounds  Physical Exam:  Vitals: Temperature 96.7 pulse 83 respiratory rate is 20 blood pressure is 106/67 saturations 100%  Ventilator Settings on T collar FiO2 of 28%  . General: Comfortable at this time . Eyes: Grossly normal lids, irises & conjunctiva . ENT: grossly tongue is normal . Neck: no obvious mass . Cardiovascular: S1 S2 normal no gallop . Respiratory: No rhonchi very coarse breath sounds . Abdomen: soft . Skin: no rash seen on limited exam . Musculoskeletal: not rigid . Psychiatric:unable to assess . Neurologic: no seizure no involuntary movements         Lab Data:   Basic Metabolic Panel: Recent Labs  Lab 11/02/20 0558  NA 132*  K 4.0  CL 99  CO2 22  GLUCOSE 129*  BUN 13  CREATININE 0.48  CALCIUM 9.0    ABG: No results for input(s): PHART, PCO2ART, PO2ART, HCO3, O2SAT in the last 168 hours.  Liver Function Tests: No results for input(s): AST, ALT, ALKPHOS, BILITOT, PROT, ALBUMIN in the last 168 hours. No results for input(s): LIPASE, AMYLASE in the last 168 hours. No results for input(s): AMMONIA in the last 168 hours.  CBC: Recent Labs  Lab 11/02/20 0558  WBC 7.2  HGB 9.4*  HCT 29.4*  MCV 91.6  PLT 300    Cardiac Enzymes: No results for input(s): CKTOTAL, CKMB, CKMBINDEX, TROPONINI in the last 168 hours.  BNP (last 3 results) No results for input(s): BNP in the last 8760 hours.  ProBNP (last 3 results) No results for input(s): PROBNP in the last 8760 hours.  Radiological  Exams: No results found.  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 Acute on chronic respiratory failure hypoxia we will continue with T collar trials titrate oxygen continue pulmonary toilet. 2. DVT treated we will continue to follow 3. Aspiration pneumonia no change supportive care still remains at risk 4. Cerebral infarction supportive care we will continue present management 5. Acute delirium no change   I have personally seen and evaluated the patient, evaluated laboratory and imaging results, formulated the assessment and plan and placed orders. The Patient requires high complexity decision making with multiple systems involvement.  Rounds were done with the Respiratory Therapy Director and Staff therapists and discussed with nursing staff also.  Yevonne Pax, MD [facility] Pulmonary Critical Care Medicine Sleep Medicine

## 2020-11-08 ENCOUNTER — Other Ambulatory Visit (HOSPITAL_COMMUNITY): Payer: Medicare HMO

## 2020-11-09 DIAGNOSIS — J9621 Acute and chronic respiratory failure with hypoxia: Secondary | ICD-10-CM | POA: Diagnosis not present

## 2020-11-09 DIAGNOSIS — J690 Pneumonitis due to inhalation of food and vomit: Secondary | ICD-10-CM | POA: Diagnosis not present

## 2020-11-09 DIAGNOSIS — R410 Disorientation, unspecified: Secondary | ICD-10-CM | POA: Diagnosis not present

## 2020-11-09 DIAGNOSIS — I63442 Cerebral infarction due to embolism of left cerebellar artery: Secondary | ICD-10-CM | POA: Diagnosis not present

## 2020-11-09 LAB — BASIC METABOLIC PANEL
Anion gap: 7 (ref 5–15)
BUN: 17 mg/dL (ref 6–20)
CO2: 28 mmol/L (ref 22–32)
Calcium: 9.1 mg/dL (ref 8.9–10.3)
Chloride: 111 mmol/L (ref 98–111)
Creatinine, Ser: 0.44 mg/dL (ref 0.44–1.00)
GFR, Estimated: >60 mL/min (ref >60)
Glucose, Bld: 82 mg/dL (ref 70–99)
Potassium: 3.2 mmol/L — ABNORMAL LOW (ref 3.5–5.1)
Sodium: 146 mmol/L — ABNORMAL HIGH (ref 135–145)

## 2020-11-09 LAB — CBC
HCT: 29.6 % — ABNORMAL LOW (ref 36.0–46.0)
Hemoglobin: 9 g/dL — ABNORMAL LOW (ref 12.0–15.0)
MCH: 28.5 pg (ref 26.0–34.0)
MCHC: 30.4 g/dL (ref 30.0–36.0)
MCV: 93.7 fL (ref 80.0–100.0)
Platelets: 289 10*3/uL (ref 150–400)
RBC: 3.16 MIL/uL — ABNORMAL LOW (ref 3.87–5.11)
RDW: 15.9 % — ABNORMAL HIGH (ref 11.5–15.5)
WBC: 11.7 10*3/uL — ABNORMAL HIGH (ref 4.0–10.5)
nRBC: 0 % (ref 0.0–0.2)

## 2020-11-09 LAB — MAGNESIUM: Magnesium: 1.6 mg/dL — ABNORMAL LOW (ref 1.7–2.4)

## 2020-11-09 NOTE — Progress Notes (Signed)
Pulmonary Critical Care Medicine [facility] [place]   PULMONARY CRITICAL CARE SERVICE  PROGRESS NOTE  Date of Service: 11/09/2020  Jasmine Davis  QIW:[id number]  DOB: Sep 01, 1961   DOA: 10/27/2020  Referring Physician: Carron Curie, MD  HPI: Jasmine Davis is a 59 y.o. female seen for follow up of Acute on Chronic Respiratory Failure.  Patient is on T collar has been on 28% FiO2 using PMV good saturations  Medications: Reviewed on Rounds  Physical Exam:  Vitals: Temperature is 97.1 pulse 85 respiratory 21 blood pressure is 114/84 saturations 98%  Ventilator Settings on T collar with PMV  . General: Comfortable at this time . Eyes: Grossly normal lids, irises & conjunctiva . ENT: grossly tongue is normal . Neck: no obvious mass . Cardiovascular: S1 S2 normal no gallop . Respiratory: No rhonchi very coarse breath sounds . Abdomen: soft . Skin: no rash seen on limited exam . Musculoskeletal: not rigid . Psychiatric:unable to assess . Neurologic: no seizure no involuntary movements         Lab Data:   Basic Metabolic Panel: Recent Labs  Lab 11/09/20 1142  NA 146*  K 3.2*  CL 111  CO2 28  GLUCOSE 82  BUN 17  CREATININE 0.44  CALCIUM 9.1  MG 1.6*    ABG: No results for input(s): PHART, PCO2ART, PO2ART, HCO3, O2SAT in the last 168 hours.  Liver Function Tests: No results for input(s): AST, ALT, ALKPHOS, BILITOT, PROT, ALBUMIN in the last 168 hours. No results for input(s): LIPASE, AMYLASE in the last 168 hours. No results for input(s): AMMONIA in the last 168 hours.  CBC: Recent Labs  Lab 11/09/20 1142  WBC 11.7*  HGB 9.0*  HCT 29.6*  MCV 93.7  PLT 289    Cardiac Enzymes: No results for input(s): CKTOTAL, CKMB, CKMBINDEX, TROPONINI in the last 168 hours.  BNP (last 3 results) No results for input(s): BNP in the last 8760 hours.  ProBNP (last 3 results) No results for input(s): PROBNP in the last 8760 hours.  Radiological  Exams: No results found.  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 Acute on chronic respiratory failure hypoxia we will continue with T-piece titrate oxygen continue pulmonary toilet 2. DVT treated continue to follow 3. Aspiration pneumonia supportive care at risk for recurrence 4. Cerebral infarction supportive care 5. Acute delirium no change   I have personally seen and evaluated the patient, evaluated laboratory and imaging results, formulated the assessment and plan and placed orders. The Patient requires high complexity decision making with multiple systems involvement.  Rounds were done with the Respiratory Therapy Director and Staff therapists and discussed with nursing staff also.  Yevonne Pax, MD [facility] Pulmonary Critical Care Medicine Sleep Medicine

## 2020-11-10 DIAGNOSIS — R410 Disorientation, unspecified: Secondary | ICD-10-CM | POA: Diagnosis not present

## 2020-11-10 DIAGNOSIS — J9621 Acute and chronic respiratory failure with hypoxia: Secondary | ICD-10-CM | POA: Diagnosis not present

## 2020-11-10 DIAGNOSIS — I63442 Cerebral infarction due to embolism of left cerebellar artery: Secondary | ICD-10-CM | POA: Diagnosis not present

## 2020-11-10 DIAGNOSIS — J690 Pneumonitis due to inhalation of food and vomit: Secondary | ICD-10-CM | POA: Diagnosis not present

## 2020-11-10 LAB — URINALYSIS, MICROSCOPIC (REFLEX): RBC / HPF: >50 RBC/hpf (ref 0–5)

## 2020-11-10 LAB — URINALYSIS, ROUTINE W REFLEX MICROSCOPIC

## 2020-11-10 LAB — PROTIME-INR
INR: 1.4 — ABNORMAL HIGH (ref 0.8–1.2)
Prothrombin Time: 16.8 seconds — ABNORMAL HIGH (ref 11.4–15.2)

## 2020-11-10 LAB — APTT: aPTT: 35 seconds (ref 24–36)

## 2020-11-10 NOTE — Progress Notes (Signed)
Pulmonary Critical Care Medicine [facility] [place]   PULMONARY CRITICAL CARE SERVICE  PROGRESS NOTE  Date of Service: 11/10/2020  Jasmine Davis  IDP:[id number]  DOB: 1961-11-25   DOA: 10/27/2020  Referring Physician: Carron Curie, MD  HPI: Jasmine Davis is a 59 y.o. female seen for follow up of Acute on Chronic Respiratory Failure.  Patient is on T collar currently on 28% FiO2 with good saturations  Medications: Reviewed on Rounds  Physical Exam:  Vitals: Temperature is 98.7 pulse one hundred respiratory rate twenty-one blood pressure is 125/80 saturations 100%  Ventilator Settings off the ventilator on T collar  . General: Comfortable at this time . Eyes: Grossly normal lids, irises & conjunctiva . ENT: grossly tongue is normal . Neck: no obvious mass . Cardiovascular: S1 S2 normal no gallop . Respiratory: No rhonchi very coarse breath sounds . Abdomen: soft . Skin: no rash seen on limited exam . Musculoskeletal: not rigid . Psychiatric:unable to assess . Neurologic: no seizure no involuntary movements         Lab Data:   Basic Metabolic Panel: Recent Labs  Lab 11/09/20 1142  NA 146*  K 3.2*  CL 111  CO2 28  GLUCOSE 82  BUN 17  CREATININE 0.44  CALCIUM 9.1  MG 1.6*    ABG: No results for input(s): PHART, PCO2ART, PO2ART, HCO3, O2SAT in the last 168 hours.  Liver Function Tests: No results for input(s): AST, ALT, ALKPHOS, BILITOT, PROT, ALBUMIN in the last 168 hours. No results for input(s): LIPASE, AMYLASE in the last 168 hours. No results for input(s): AMMONIA in the last 168 hours.  CBC: Recent Labs  Lab 11/09/20 1142  WBC 11.7*  HGB 9.0*  HCT 29.6*  MCV 93.7  PLT 289    Cardiac Enzymes: No results for input(s): CKTOTAL, CKMB, CKMBINDEX, TROPONINI in the last 168 hours.  BNP (last 3 results) No results for input(s): BNP in the last 8760 hours.  ProBNP (last 3 results) No results for input(s): PROBNP in the last 8760  hours.  Radiological Exams: No results found.  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 Acute on chronic respiratory failure hypoxia we will continue T-piece titrate oxygen continue pulmonary toilet. 2. DVT treated and improving 3. Aspiration pneumonia treated we will continue to follow along. 4. Cerebral infarction supportive care 5. Acute delirium no change   I have personally seen and evaluated the patient, evaluated laboratory and imaging results, formulated the assessment and plan and placed orders. The Patient requires high complexity decision making with multiple systems involvement.  Rounds were done with the Respiratory Therapy Director and Staff therapists and discussed with nursing staff also.  Yevonne Pax, MD [facility] Pulmonary Critical Care Medicine Sleep Medicine

## 2020-11-11 DIAGNOSIS — J9621 Acute and chronic respiratory failure with hypoxia: Secondary | ICD-10-CM | POA: Diagnosis not present

## 2020-11-11 DIAGNOSIS — I63442 Cerebral infarction due to embolism of left cerebellar artery: Secondary | ICD-10-CM | POA: Diagnosis not present

## 2020-11-11 DIAGNOSIS — J690 Pneumonitis due to inhalation of food and vomit: Secondary | ICD-10-CM | POA: Diagnosis not present

## 2020-11-11 DIAGNOSIS — R410 Disorientation, unspecified: Secondary | ICD-10-CM | POA: Diagnosis not present

## 2020-11-11 LAB — BASIC METABOLIC PANEL
Anion gap: 8 (ref 5–15)
BUN: 18 mg/dL (ref 6–20)
CO2: 26 mmol/L (ref 22–32)
Calcium: 8.7 mg/dL — ABNORMAL LOW (ref 8.9–10.3)
Chloride: 112 mmol/L — ABNORMAL HIGH (ref 98–111)
Creatinine, Ser: 0.4 mg/dL — ABNORMAL LOW (ref 0.44–1.00)
GFR, Estimated: >60 mL/min (ref >60)
Glucose, Bld: 138 mg/dL — ABNORMAL HIGH (ref 70–99)
Potassium: 3.2 mmol/L — ABNORMAL LOW (ref 3.5–5.1)
Sodium: 146 mmol/L — ABNORMAL HIGH (ref 135–145)

## 2020-11-11 LAB — CBC
HCT: 26.8 % — ABNORMAL LOW (ref 36.0–46.0)
Hemoglobin: 8.2 g/dL — ABNORMAL LOW (ref 12.0–15.0)
MCH: 28.7 pg (ref 26.0–34.0)
MCHC: 30.6 g/dL (ref 30.0–36.0)
MCV: 93.7 fL (ref 80.0–100.0)
Platelets: 266 10*3/uL (ref 150–400)
RBC: 2.86 MIL/uL — ABNORMAL LOW (ref 3.87–5.11)
RDW: 15.9 % — ABNORMAL HIGH (ref 11.5–15.5)
WBC: 10.9 10*3/uL — ABNORMAL HIGH (ref 4.0–10.5)
nRBC: 0 % (ref 0.0–0.2)

## 2020-11-11 NOTE — Progress Notes (Signed)
Pulmonary Critical Care Medicine [facility] [place]   PULMONARY CRITICAL CARE SERVICE  PROGRESS NOTE  Date of Service: 11/11/2020  Jasmine Davis  UDJ:[id number]  DOB: 05-30-1961   DOA: 10/27/2020  Referring Physician: Carron Curie, MD  HPI: Jasmine Davis is a 59 y.o. female seen for follow up of Acute on Chronic Respiratory Failure.  Patient currently is on T collar has been on 28% FiO2 good saturations are noted secretions are copious  Medications: Reviewed on Rounds  Physical Exam:  Vitals: Temperature 99.0 pulse 81 respiratory rate is 20 blood pressure is 112/69 saturations 96%  Ventilator Settings on T collar with an FiO2 28%  . General: Comfortable at this time . Eyes: Grossly normal lids, irises & conjunctiva . ENT: grossly tongue is normal . Neck: no obvious mass . Cardiovascular: S1 S2 normal no gallop . Respiratory: No rhonchi very coarse breath sounds . Abdomen: soft . Skin: no rash seen on limited exam . Musculoskeletal: not rigid . Psychiatric:unable to assess . Neurologic: no seizure no involuntary movements         Lab Data:   Basic Metabolic Panel: Recent Labs  Lab 11/09/20 1142 11/11/20 0603  NA 146* 146*  K 3.2* 3.2*  CL 111 112*  CO2 28 26  GLUCOSE 82 138*  BUN 17 18  CREATININE 0.44 0.40*  CALCIUM 9.1 8.7*  MG 1.6*  --     ABG: No results for input(s): PHART, PCO2ART, PO2ART, HCO3, O2SAT in the last 168 hours.  Liver Function Tests: No results for input(s): AST, ALT, ALKPHOS, BILITOT, PROT, ALBUMIN in the last 168 hours. No results for input(s): LIPASE, AMYLASE in the last 168 hours. No results for input(s): AMMONIA in the last 168 hours.  CBC: Recent Labs  Lab 11/09/20 1142 11/11/20 0603  WBC 11.7* 10.9*  HGB 9.0* 8.2*  HCT 29.6* 26.8*  MCV 93.7 93.7  PLT 289 266    Cardiac Enzymes: No results for input(s): CKTOTAL, CKMB, CKMBINDEX, TROPONINI in the last 168 hours.  BNP (last 3 results) No results for  input(s): BNP in the last 8760 hours.  ProBNP (last 3 results) No results for input(s): PROBNP in the last 8760 hours.  Radiological Exams: No results found.  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 Acute on chronic respiratory failure hypoxia we will continue with T-piece on 20% FiO2 titrate oxygen as tolerated. 2. DVT treated slow improvement 3. Aspiration pneumonia no change 4. Cerebral infarction at baseline we will continue with supportive care 5. Acute delirium no change continue present therapy   I have personally seen and evaluated the patient, evaluated laboratory and imaging results, formulated the assessment and plan and placed orders. The Patient requires high complexity decision making with multiple systems involvement.  Rounds were done with the Respiratory Therapy Director and Staff therapists and discussed with nursing staff also.  Yevonne Pax, MD [facility] Pulmonary Critical Care Medicine Sleep Medicine

## 2020-11-12 ENCOUNTER — Other Ambulatory Visit (HOSPITAL_COMMUNITY): Payer: Medicare HMO

## 2020-11-12 DIAGNOSIS — J9621 Acute and chronic respiratory failure with hypoxia: Secondary | ICD-10-CM | POA: Diagnosis not present

## 2020-11-12 DIAGNOSIS — I63442 Cerebral infarction due to embolism of left cerebellar artery: Secondary | ICD-10-CM | POA: Diagnosis not present

## 2020-11-12 DIAGNOSIS — R410 Disorientation, unspecified: Secondary | ICD-10-CM | POA: Diagnosis not present

## 2020-11-12 DIAGNOSIS — J690 Pneumonitis due to inhalation of food and vomit: Secondary | ICD-10-CM | POA: Diagnosis not present

## 2020-11-12 LAB — CBC
HCT: 28.9 % — ABNORMAL LOW (ref 36.0–46.0)
Hemoglobin: 8.6 g/dL — ABNORMAL LOW (ref 12.0–15.0)
MCH: 28.2 pg (ref 26.0–34.0)
MCHC: 29.8 g/dL — ABNORMAL LOW (ref 30.0–36.0)
MCV: 94.8 fL (ref 80.0–100.0)
Platelets: 257 10*3/uL (ref 150–400)
RBC: 3.05 MIL/uL — ABNORMAL LOW (ref 3.87–5.11)
RDW: 15.8 % — ABNORMAL HIGH (ref 11.5–15.5)
WBC: 13.5 10*3/uL — ABNORMAL HIGH (ref 4.0–10.5)
nRBC: 0 % (ref 0.0–0.2)

## 2020-11-12 LAB — POTASSIUM: Potassium: 3.9 mmol/L (ref 3.5–5.1)

## 2020-11-12 NOTE — Progress Notes (Signed)
Pulmonary Critical Care Medicine [facility] [place]   PULMONARY CRITICAL CARE SERVICE  PROGRESS NOTE  Date of Service: 11/12/2020  VERENICE WESTRICH  HMC:[id number]  DOB: 1961-02-06   DOA: 10/27/2020  Referring Physician: Carron Curie, MD  HPI: AARIYANA MANZ is a 59 y.o. female seen for follow up of Acute on Chronic Respiratory Failure.  She remains on T collar currently has been on 20% FiO2 using PMV  Medications: Reviewed on Rounds  Physical Exam:  Vitals: Temperature is 97.2 pulse 86 respiratory rate 20 blood pressure is 113/72 saturations 97%  Ventilator Settings on T collar with an FiO2 28% using PMV  . General: Comfortable at this time . Eyes: Grossly normal lids, irises & conjunctiva . ENT: grossly tongue is normal . Neck: no obvious mass . Cardiovascular: S1 S2 normal no gallop . Respiratory: No rhonchi no rales are noted at this time . Abdomen: soft . Skin: no rash seen on limited exam . Musculoskeletal: not rigid . Psychiatric:unable to assess . Neurologic: no seizure no involuntary movements         Lab Data:   Basic Metabolic Panel: Recent Labs  Lab 11/09/20 1142 11/11/20 0603  NA 146* 146*  K 3.2* 3.2*  CL 111 112*  CO2 28 26  GLUCOSE 82 138*  BUN 17 18  CREATININE 0.44 0.40*  CALCIUM 9.1 8.7*  MG 1.6*  --     ABG: No results for input(s): PHART, PCO2ART, PO2ART, HCO3, O2SAT in the last 168 hours.  Liver Function Tests: No results for input(s): AST, ALT, ALKPHOS, BILITOT, PROT, ALBUMIN in the last 168 hours. No results for input(s): LIPASE, AMYLASE in the last 168 hours. No results for input(s): AMMONIA in the last 168 hours.  CBC: Recent Labs  Lab 11/09/20 1142 11/11/20 0603 11/12/20 0614  WBC 11.7* 10.9* 13.5*  HGB 9.0* 8.2* 8.6*  HCT 29.6* 26.8* 28.9*  MCV 93.7 93.7 94.8  PLT 289 266 257    Cardiac Enzymes: No results for input(s): CKTOTAL, CKMB, CKMBINDEX, TROPONINI in the last 168 hours.  BNP (last 3  results) No results for input(s): BNP in the last 8760 hours.  ProBNP (last 3 results) No results for input(s): PROBNP in the last 8760 hours.  Radiological Exams: No results found.  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 Acute on chronic respiratory failure hypoxia we will continue with T collar 28% FiO2 PMV will be used also secretions are still significant. 2. DVT treated continue to follow along. 3. Aspiration pneumonia treated slow improvement 4. Acute stroke cerebral infarction supportive care 5. Delirium no change   I have personally seen and evaluated the patient, evaluated laboratory and imaging results, formulated the assessment and plan and placed orders. The Patient requires high complexity decision making with multiple systems involvement.  Rounds were done with the Respiratory Therapy Director and Staff therapists and discussed with nursing staff also.  Yevonne Pax, MD [facility] Pulmonary Critical Care Medicine Sleep Medicine

## 2020-11-13 DIAGNOSIS — I63442 Cerebral infarction due to embolism of left cerebellar artery: Secondary | ICD-10-CM | POA: Diagnosis not present

## 2020-11-13 DIAGNOSIS — J9621 Acute and chronic respiratory failure with hypoxia: Secondary | ICD-10-CM | POA: Diagnosis not present

## 2020-11-13 DIAGNOSIS — J690 Pneumonitis due to inhalation of food and vomit: Secondary | ICD-10-CM | POA: Diagnosis not present

## 2020-11-13 DIAGNOSIS — R410 Disorientation, unspecified: Secondary | ICD-10-CM | POA: Diagnosis not present

## 2020-11-13 NOTE — Progress Notes (Signed)
Pulmonary Critical Care Medicine [facility] [place]   PULMONARY CRITICAL CARE SERVICE  PROGRESS NOTE  Date of Service: 11/13/2020  Jasmine Davis  ZOX:[id number]  DOB: 1961-01-18   DOA: 10/27/2020  Referring Physician: Carron Curie, MD  HPI: Jasmine Davis is a 59 y.o. female seen for follow up of Acute on Chronic Respiratory Failure. Patient currently is on T collar has been on 28% FiO2 good saturations are noted  Medications: Reviewed on Rounds  Physical Exam:  Vitals: Temperature is 99.0 pulse 89 respiratory rate 20 blood pressure is 116/68 saturations 99%  Ventilator Settings on T collar with an FiO2 28%  . General: Comfortable at this time . Eyes: Grossly normal lids, irises & conjunctiva . ENT: grossly tongue is normal . Neck: no obvious mass . Cardiovascular: S1 S2 normal no gallop . Respiratory: Scattered rhonchi expansion is equal . Abdomen: soft . Skin: no rash seen on limited exam . Musculoskeletal: not rigid . Psychiatric:unable to assess . Neurologic: no seizure no involuntary movements         Lab Data:   Basic Metabolic Panel: Recent Labs  Lab 11/09/20 1142 11/11/20 0603 11/12/20 1009  NA 146* 146*  --   K 3.2* 3.2* 3.9  CL 111 112*  --   CO2 28 26  --   GLUCOSE 82 138*  --   BUN 17 18  --   CREATININE 0.44 0.40*  --   CALCIUM 9.1 8.7*  --   MG 1.6*  --   --     ABG: No results for input(s): PHART, PCO2ART, PO2ART, HCO3, O2SAT in the last 168 hours.  Liver Function Tests: No results for input(s): AST, ALT, ALKPHOS, BILITOT, PROT, ALBUMIN in the last 168 hours. No results for input(s): LIPASE, AMYLASE in the last 168 hours. No results for input(s): AMMONIA in the last 168 hours.  CBC: Recent Labs  Lab 11/09/20 1142 11/11/20 0603 11/12/20 0614  WBC 11.7* 10.9* 13.5*  HGB 9.0* 8.2* 8.6*  HCT 29.6* 26.8* 28.9*  MCV 93.7 93.7 94.8  PLT 289 266 257    Cardiac Enzymes: No results for input(s): CKTOTAL, CKMB, CKMBINDEX,  TROPONINI in the last 168 hours.  BNP (last 3 results) No results for input(s): BNP in the last 8760 hours.  ProBNP (last 3 results) No results for input(s): PROBNP in the last 8760 hours.  Radiological Exams: DG CHEST PORT 1 VIEW  Result Date: 11/12/2020 CLINICAL DATA:  Prior respiratory failure and airspace opacities EXAM: PORTABLE CHEST 1 VIEW COMPARISON:  11/08/2020 FINDINGS: The patient is rotated to the left on today's radiograph, reducing diagnostic sensitivity and specificity. Tracheostomy tube noted. Atherosclerotic calcification of the aortic arch. Accounting for the striking degree of leftward rotation, no definite residual airspace opacity is identified. No blunting of the costophrenic angles. IMPRESSION: 1. Accounting for the striking degree of leftward rotation, no definite airspace opacity. 2. Tracheostomy tube noted. Electronically Signed   By: Gaylyn Rong M.D.   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 Acute on chronic respiratory failure with hypoxia patient currently is on 20% FiO2 will continue on aggressive pulmonary toilet still has copious secretions noted. Chest x-ray results as noted above.  2. DVT treated we will continue to follow 3. Aspiration pneumonia remains in place continue with aspiration precautions 4. Cerebral infarction supportive care 5. Acute delirium she is at baseline   I have personally seen and evaluated the patient, evaluated laboratory and imaging results, formulated the assessment and plan and placed orders. The Patient requires high complexity decision making with multiple systems involvement.  Rounds were done with the Respiratory Therapy Director and  Staff therapists and discussed with nursing staff also.  Yevonne Pax, MD [facility] Pulmonary Critical Care Medicine Sleep Medicine

## 2020-11-14 DIAGNOSIS — R410 Disorientation, unspecified: Secondary | ICD-10-CM | POA: Diagnosis not present

## 2020-11-14 DIAGNOSIS — I63442 Cerebral infarction due to embolism of left cerebellar artery: Secondary | ICD-10-CM | POA: Diagnosis not present

## 2020-11-14 DIAGNOSIS — J690 Pneumonitis due to inhalation of food and vomit: Secondary | ICD-10-CM | POA: Diagnosis not present

## 2020-11-14 DIAGNOSIS — J9621 Acute and chronic respiratory failure with hypoxia: Secondary | ICD-10-CM | POA: Diagnosis not present

## 2020-11-14 NOTE — Progress Notes (Signed)
Pulmonary Critical Care Medicine [facility] [place]   PULMONARY CRITICAL CARE SERVICE  PROGRESS NOTE  Date of Service: 11/14/2020  Jasmine Davis  KAJ:[id number]  DOB: 05-26-1961   DOA: 10/27/2020  Referring Physician: Carron Curie, MD  HPI: Jasmine Davis is a 59 y.o. female seen for follow up of Acute on Chronic Respiratory Failure.  Patient currently is on T collar has been on 28% FiO2 using the PMV  Medications: Reviewed on Rounds  Physical Exam:  Vitals: Temperature is 96.6 pulse 86 respiratory rate 16 blood pressure is 120/86 saturations 100%  Ventilator Settings on T collar with an FiO2 28%  . General: Comfortable at this time . Eyes: Grossly normal lids, irises & conjunctiva . ENT: grossly tongue is normal . Neck: no obvious mass . Cardiovascular: S1 S2 normal no gallop . Respiratory: No rhonchi no rales noted at this time . Abdomen: soft . Skin: no rash seen on limited exam . Musculoskeletal: not rigid . Psychiatric:unable to assess . Neurologic: no seizure no involuntary movements         Lab Data:   Basic Metabolic Panel: Recent Labs  Lab 11/09/20 1142 11/11/20 0603 11/12/20 1009  NA 146* 146*  --   K 3.2* 3.2* 3.9  CL 111 112*  --   CO2 28 26  --   GLUCOSE 82 138*  --   BUN 17 18  --   CREATININE 0.44 0.40*  --   CALCIUM 9.1 8.7*  --   MG 1.6*  --   --     ABG: No results for input(s): PHART, PCO2ART, PO2ART, HCO3, O2SAT in the last 168 hours.  Liver Function Tests: No results for input(s): AST, ALT, ALKPHOS, BILITOT, PROT, ALBUMIN in the last 168 hours. No results for input(s): LIPASE, AMYLASE in the last 168 hours. No results for input(s): AMMONIA in the last 168 hours.  CBC: Recent Labs  Lab 11/09/20 1142 11/11/20 0603 11/12/20 0614  WBC 11.7* 10.9* 13.5*  HGB 9.0* 8.2* 8.6*  HCT 29.6* 26.8* 28.9*  MCV 93.7 93.7 94.8  PLT 289 266 257    Cardiac Enzymes: No results for input(s): CKTOTAL, CKMB, CKMBINDEX, TROPONINI  in the last 168 hours.  BNP (last 3 results) No results for input(s): BNP in the last 8760 hours.  ProBNP (last 3 results) No results for input(s): PROBNP in the last 8760 hours.  Radiological Exams: DG CHEST PORT 1 VIEW  Result Date: 11/12/2020 CLINICAL DATA:  Prior respiratory failure and airspace opacities EXAM: PORTABLE CHEST 1 VIEW COMPARISON:  11/08/2020 FINDINGS: The patient is rotated to the left on today's radiograph, reducing diagnostic sensitivity and specificity. Tracheostomy tube noted. Atherosclerotic calcification of the aortic arch. Accounting for the striking degree of leftward rotation, no definite residual airspace opacity is identified. No blunting of the costophrenic angles. IMPRESSION: 1. Accounting for the striking degree of leftward rotation, no definite airspace opacity. 2. Tracheostomy tube noted. Electronically Signed   By: Gaylyn Rong M.D.   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 Acute on chronic respiratory failure hypoxia we will continue with T-piece titrate oxygen continue pulmonary toilet. 2. DVT treated we will continue to follow along 3. Aspiration  pneumonia we will continue with supportive care 4. Cerebral infarction no change we will continue to follow along 5. Acute delirium at baseline   I have personally seen and evaluated the patient, evaluated laboratory and imaging results, formulated the assessment and plan and placed orders. The Patient requires high complexity decision making with multiple systems involvement.  Rounds were done with the Respiratory Therapy Director and Staff therapists and discussed with nursing staff also.  Yevonne Pax, MD  [facility] Pulmonary Critical Care Medicine Sleep Medicine

## 2020-11-15 DIAGNOSIS — J9621 Acute and chronic respiratory failure with hypoxia: Secondary | ICD-10-CM | POA: Diagnosis not present

## 2020-11-15 DIAGNOSIS — I63442 Cerebral infarction due to embolism of left cerebellar artery: Secondary | ICD-10-CM | POA: Diagnosis not present

## 2020-11-15 DIAGNOSIS — J690 Pneumonitis due to inhalation of food and vomit: Secondary | ICD-10-CM | POA: Diagnosis not present

## 2020-11-15 DIAGNOSIS — R410 Disorientation, unspecified: Secondary | ICD-10-CM | POA: Diagnosis not present

## 2020-11-15 LAB — BASIC METABOLIC PANEL
Anion gap: 10 (ref 5–15)
BUN: 21 mg/dL — ABNORMAL HIGH (ref 6–20)
CO2: 24 mmol/L (ref 22–32)
Calcium: 9 mg/dL (ref 8.9–10.3)
Chloride: 111 mmol/L (ref 98–111)
Creatinine, Ser: 0.52 mg/dL (ref 0.44–1.00)
GFR, Estimated: >60 mL/min (ref >60)
Glucose, Bld: 107 mg/dL — ABNORMAL HIGH (ref 70–99)
Potassium: 3.1 mmol/L — ABNORMAL LOW (ref 3.5–5.1)
Sodium: 145 mmol/L (ref 135–145)

## 2020-11-15 LAB — CBC
HCT: 29.6 % — ABNORMAL LOW (ref 36.0–46.0)
Hemoglobin: 8.9 g/dL — ABNORMAL LOW (ref 12.0–15.0)
MCH: 28 pg (ref 26.0–34.0)
MCHC: 30.1 g/dL (ref 30.0–36.0)
MCV: 93.1 fL (ref 80.0–100.0)
Platelets: 263 10*3/uL (ref 150–400)
RBC: 3.18 MIL/uL — ABNORMAL LOW (ref 3.87–5.11)
RDW: 15.9 % — ABNORMAL HIGH (ref 11.5–15.5)
WBC: 12.1 10*3/uL — ABNORMAL HIGH (ref 4.0–10.5)
nRBC: 0 % (ref 0.0–0.2)

## 2020-11-15 LAB — SARS CORONAVIRUS 2 (TAT 6-24 HRS): SARS Coronavirus 2: NEGATIVE

## 2020-11-15 NOTE — Progress Notes (Signed)
Pulmonary Critical Care Medicine [facility] [place]   PULMONARY CRITICAL CARE SERVICE  PROGRESS NOTE  Date of Service: 11/15/2020  NYRAH DEMOS  JAS:[id number]  DOB: March 19, 1961   DOA: 10/27/2020  Referring Physician: Carron Curie, MD  HPI: JOHNASIA LIESE is a 59 y.o. female seen for follow up of Acute on Chronic Respiratory Failure.  Patient currently is on T collar has been on 28% FiO2 using PMV  Medications: Reviewed on Rounds  Physical Exam:  Vitals: Temperature is 98.4 pulse 102 respiratory rate is 18 blood pressure 109/67 saturations 100%  Ventilator Settings on T collar with an FiO2 of 28%  . General: Comfortable at this time . Eyes: Grossly normal lids, irises & conjunctiva . ENT: grossly tongue is normal . Neck: no obvious mass . Cardiovascular: S1 S2 normal no gallop . Respiratory: No rhonchi very coarse breath sounds . Abdomen: soft . Skin: no rash seen on limited exam . Musculoskeletal: not rigid . Psychiatric:unable to assess . Neurologic: no seizure no involuntary movements         Lab Data:   Basic Metabolic Panel: Recent Labs  Lab 11/09/20 1142 11/11/20 0603 11/12/20 1009 11/15/20 0524  NA 146* 146*  --  145  K 3.2* 3.2* 3.9 3.1*  CL 111 112*  --  111  CO2 28 26  --  24  GLUCOSE 82 138*  --  107*  BUN 17 18  --  21*  CREATININE 0.44 0.40*  --  0.52  CALCIUM 9.1 8.7*  --  9.0  MG 1.6*  --   --   --     ABG: No results for input(s): PHART, PCO2ART, PO2ART, HCO3, O2SAT in the last 168 hours.  Liver Function Tests: No results for input(s): AST, ALT, ALKPHOS, BILITOT, PROT, ALBUMIN in the last 168 hours. No results for input(s): LIPASE, AMYLASE in the last 168 hours. No results for input(s): AMMONIA in the last 168 hours.  CBC: Recent Labs  Lab 11/09/20 1142 11/11/20 0603 11/12/20 0614 11/15/20 0524  WBC 11.7* 10.9* 13.5* 12.1*  HGB 9.0* 8.2* 8.6* 8.9*  HCT 29.6* 26.8* 28.9* 29.6*  MCV 93.7 93.7 94.8 93.1  PLT 289 266  257 263    Cardiac Enzymes: No results for input(s): CKTOTAL, CKMB, CKMBINDEX, TROPONINI in the last 168 hours.  BNP (last 3 results) No results for input(s): BNP in the last 8760 hours.  ProBNP (last 3 results) No results for input(s): PROBNP in the last 8760 hours.  Radiological Exams: No results found.  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 Chronic respiratory failure with hypoxia we will continue with T-piece patient has got copious secretions limiting ability to wean further 2. DVT treated we will continue with supportive care 3. Aspiration pneumonia remains at risk 4. Cerebral infarction supportive care therapy as tolerated 5. Delirium patient is at baseline   I have personally seen and evaluated the patient, evaluated laboratory and imaging results, formulated the assessment and plan and placed orders. The Patient requires high complexity decision making with multiple systems involvement.  Rounds were done with the Respiratory Therapy Director and Staff therapists and discussed with nursing staff also.  Yevonne Pax, MD [facility] Pulmonary Critical Care Medicine Sleep  Medicine

## 2021-07-05 IMAGING — DX DG CHEST 1V PORT
1 series · 1 of 1 positions shown · non-contrast
Comparison: None.

CLINICAL DATA: Pneumonia

EXAM:
PORTABLE CHEST 1 VIEW

[chest]
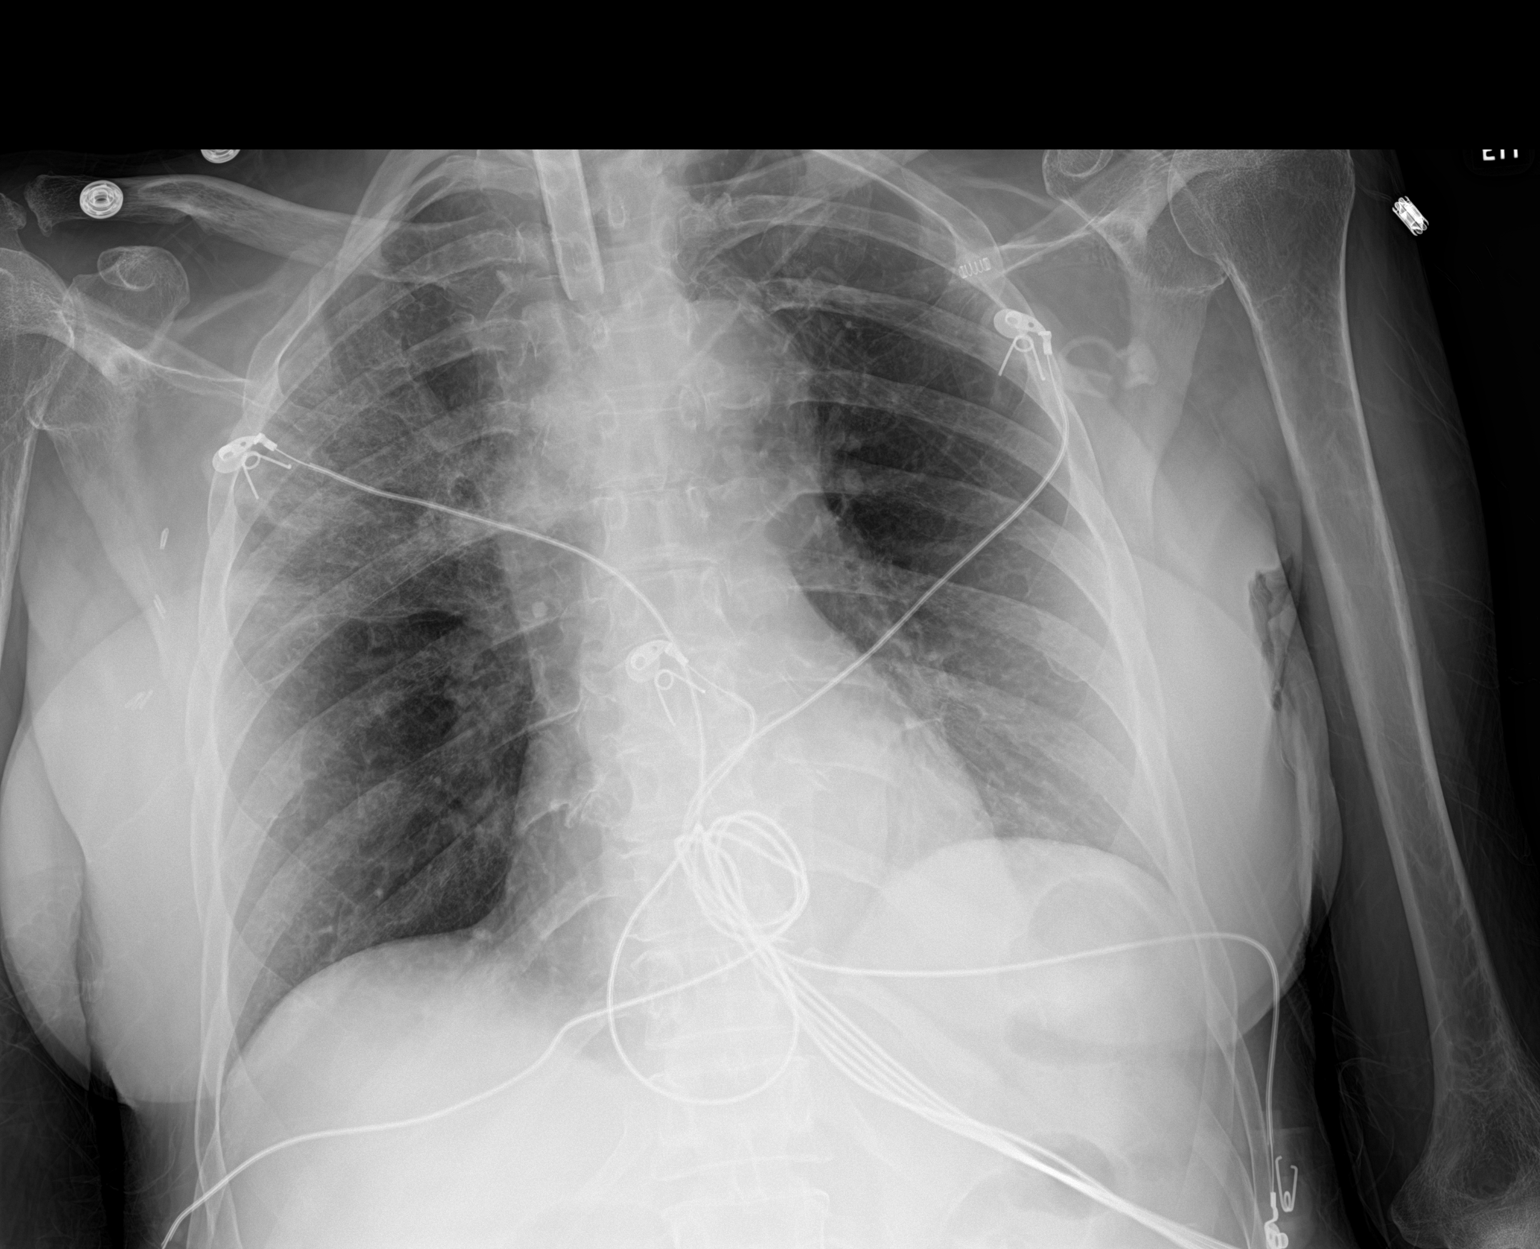

[1 of 1 positions shown; findings below may reference images not displayed]

FINDINGS: Tracheostomy tube in place. Hazy pulmonary infiltrates in the right
upper lobe and left lower lobe. No dense consolidation or lobar
collapse. No effusions. No acute bone finding.
IMPRESSION: Hazy pulmonary infiltrates in the right upper lobe and left lower
lobe consistent with pneumonia.

## 2021-07-10 IMAGING — DX DG CHEST 1V PORT
1 series · 1 of 1 positions shown · non-contrast
Comparison: Radiograph 10/27/2020

CLINICAL DATA: Respiratory failure.

EXAM:
PORTABLE CHEST 1 VIEW

[chest]
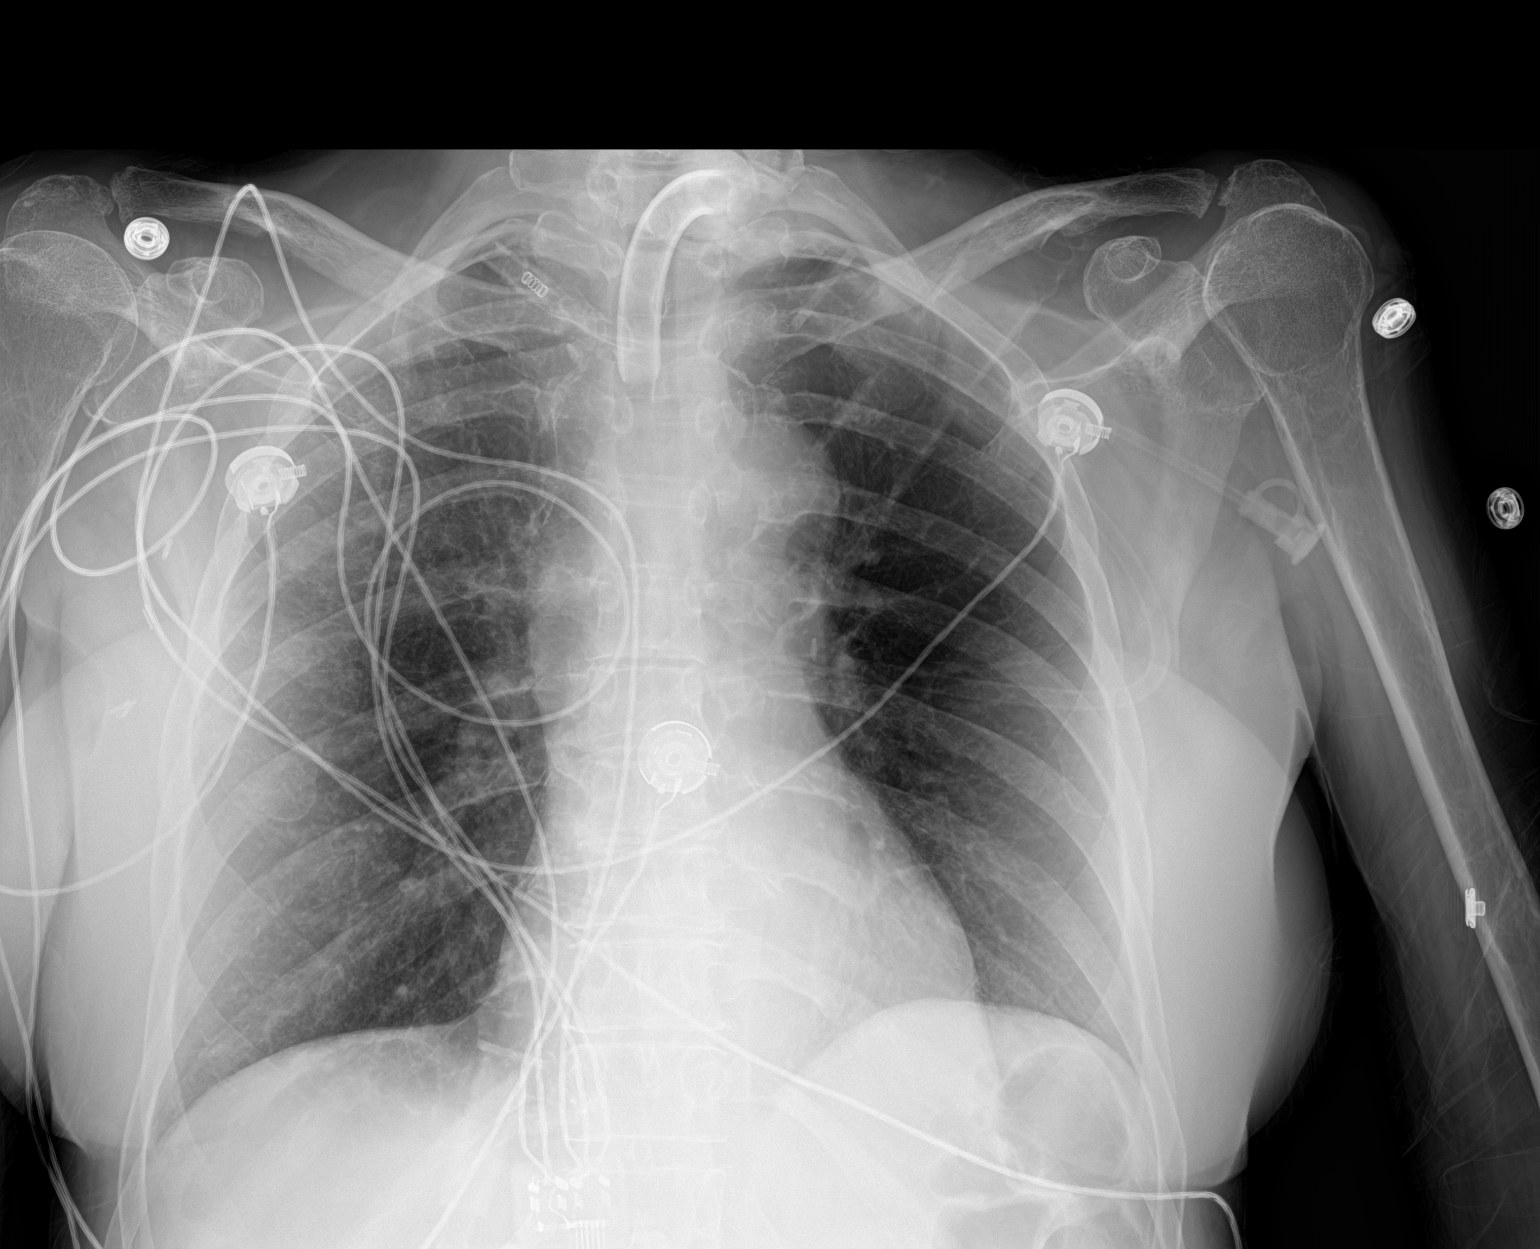

[1 of 1 positions shown; findings below may reference images not displayed]

FINDINGS: Tip of the enteric tube at the thoracic inlet. Improving right upper
lobe opacity from prior exam. Improving left lower lobe opacity from
prior exam. Mild residual patchy airspace disease persists. No new
consolidation. Normal heart size with unchanged mediastinal
contours. No pneumothorax or significant pleural effusion. Stable
osseous structures.
IMPRESSION: Improving bilateral airspace disease with mild residual patchy
opacities.

## 2021-11-30 DEATH — deceased
